# Patient Record
Sex: Male | Born: 1970
Health system: Southern US, Community
[De-identification: ages and names within clinical notes are randomized; demographics above are authoritative.]

---

## 2010-06-16 ENCOUNTER — Emergency Department (HOSPITAL_COMMUNITY)
Admission: EM | Admit: 2010-06-16 | Discharge: 2010-06-16 | Disposition: A | Discharge status: Home / Self Care | Payer: No Typology Code available for payment source | Attending: Emergency Medicine | Admitting: Emergency Medicine

## 2010-06-16 ENCOUNTER — Emergency Department (HOSPITAL_COMMUNITY): Payer: No Typology Code available for payment source

## 2010-06-16 DIAGNOSIS — S93409A Sprain of unspecified ligament of unspecified ankle, initial encounter: Secondary | ICD-10-CM | POA: Insufficient documentation

## 2010-06-16 DIAGNOSIS — IMO0002 Reserved for concepts with insufficient information to code with codable children: Secondary | ICD-10-CM | POA: Insufficient documentation
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
## 2018-01-02 ENCOUNTER — Encounter (HOSPITAL_BASED_OUTPATIENT_CLINIC_OR_DEPARTMENT_OTHER): Payer: Self-pay | Admitting: *Deleted

## 2018-01-02 ENCOUNTER — Other Ambulatory Visit: Payer: Self-pay

## 2018-01-02 ENCOUNTER — Emergency Department (HOSPITAL_BASED_OUTPATIENT_CLINIC_OR_DEPARTMENT_OTHER)
Admission: EM | Admit: 2018-01-02 | Discharge: 2018-01-02 | Disposition: A | Discharge status: Home / Self Care | Payer: BLUE CROSS/BLUE SHIELD | Attending: Emergency Medicine | Admitting: Emergency Medicine

## 2018-01-02 DIAGNOSIS — Y939 Activity, unspecified: Secondary | ICD-10-CM | POA: Diagnosis not present

## 2018-01-02 DIAGNOSIS — Y999 Unspecified external cause status: Secondary | ICD-10-CM | POA: Diagnosis not present

## 2018-01-02 DIAGNOSIS — Y92410 Unspecified street and highway as the place of occurrence of the external cause: Secondary | ICD-10-CM | POA: Diagnosis not present

## 2018-01-02 DIAGNOSIS — T148XXA Other injury of unspecified body region, initial encounter: Secondary | ICD-10-CM

## 2018-01-02 DIAGNOSIS — S29012A Strain of muscle and tendon of back wall of thorax, initial encounter: Secondary | ICD-10-CM | POA: Insufficient documentation

## 2018-01-02 DIAGNOSIS — S299XXA Unspecified injury of thorax, initial encounter: Secondary | ICD-10-CM | POA: Diagnosis present

## 2018-01-02 MED ORDER — METHOCARBAMOL 500 MG PO TABS: 500.0000 mg | ORAL_TABLET | Freq: Two times a day (BID) | ORAL | 0 refills | Status: AC

## 2018-01-02 MED FILL — METHOCARBAMOL 500 MG TABLET: 500 | 10 days supply | Qty: 20 | Fill #0
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
## 2018-01-02 NOTE — Discharge Instructions (Signed)
